# Patient Record
Sex: Male | Born: 1945 | Race: White | Hispanic: No | Marital: Single | Smoking: Former smoker
Health system: Southern US, Community
[De-identification: ages and names within clinical notes are randomized; demographics above are authoritative.]

## PROBLEM LIST (undated history)

## (undated) DIAGNOSIS — I10 Essential (primary) hypertension: Secondary | ICD-10-CM

## (undated) DIAGNOSIS — I2510 Atherosclerotic heart disease of native coronary artery without angina pectoris: Secondary | ICD-10-CM

## (undated) HISTORY — PX: OTHER SURGICAL HISTORY: SHX169

---

## 2014-04-11 ENCOUNTER — Telehealth (HOSPITAL_BASED_OUTPATIENT_CLINIC_OR_DEPARTMENT_OTHER): Payer: Self-pay | Admitting: Emergency Medicine

## 2014-04-11 ENCOUNTER — Inpatient Hospital Stay (HOSPITAL_COMMUNITY)
Admission: EM | Admit: 2014-04-11 | Discharge: 2014-04-12 | DRG: 689 | Disposition: A | Discharge status: Home / Self Care | Payer: Medicare Other | Attending: Internal Medicine | Admitting: Internal Medicine

## 2014-04-11 ENCOUNTER — Encounter (HOSPITAL_BASED_OUTPATIENT_CLINIC_OR_DEPARTMENT_OTHER): Payer: Self-pay

## 2014-04-11 ENCOUNTER — Emergency Department (HOSPITAL_BASED_OUTPATIENT_CLINIC_OR_DEPARTMENT_OTHER)
Admission: EM | Admit: 2014-04-11 | Discharge: 2014-04-11 | Disposition: A | Discharge status: Home / Self Care | Payer: Non-veteran care | Attending: Emergency Medicine | Admitting: Emergency Medicine

## 2014-04-11 ENCOUNTER — Encounter (HOSPITAL_COMMUNITY): Payer: Self-pay

## 2014-04-11 ENCOUNTER — Emergency Department (HOSPITAL_BASED_OUTPATIENT_CLINIC_OR_DEPARTMENT_OTHER): Payer: Non-veteran care

## 2014-04-11 DIAGNOSIS — J189 Pneumonia, unspecified organism: Secondary | ICD-10-CM | POA: Diagnosis present

## 2014-04-11 DIAGNOSIS — Z79899 Other long term (current) drug therapy: Secondary | ICD-10-CM | POA: Diagnosis not present

## 2014-04-11 DIAGNOSIS — R319 Hematuria, unspecified: Secondary | ICD-10-CM | POA: Diagnosis present

## 2014-04-11 DIAGNOSIS — B349 Viral infection, unspecified: Secondary | ICD-10-CM | POA: Diagnosis present

## 2014-04-11 DIAGNOSIS — R0602 Shortness of breath: Secondary | ICD-10-CM

## 2014-04-11 DIAGNOSIS — Z955 Presence of coronary angioplasty implant and graft: Secondary | ICD-10-CM

## 2014-04-11 DIAGNOSIS — A419 Sepsis, unspecified organism: Secondary | ICD-10-CM | POA: Diagnosis present

## 2014-04-11 DIAGNOSIS — Z87891 Personal history of nicotine dependence: Secondary | ICD-10-CM | POA: Insufficient documentation

## 2014-04-11 DIAGNOSIS — Z7982 Long term (current) use of aspirin: Secondary | ICD-10-CM | POA: Diagnosis not present

## 2014-04-11 DIAGNOSIS — B9689 Other specified bacterial agents as the cause of diseases classified elsewhere: Secondary | ICD-10-CM | POA: Diagnosis present

## 2014-04-11 DIAGNOSIS — N3000 Acute cystitis without hematuria: Secondary | ICD-10-CM | POA: Diagnosis not present

## 2014-04-11 DIAGNOSIS — R509 Fever, unspecified: Secondary | ICD-10-CM | POA: Diagnosis present

## 2014-04-11 DIAGNOSIS — I2510 Atherosclerotic heart disease of native coronary artery without angina pectoris: Secondary | ICD-10-CM | POA: Insufficient documentation

## 2014-04-11 DIAGNOSIS — A4150 Gram-negative sepsis, unspecified: Secondary | ICD-10-CM

## 2014-04-11 DIAGNOSIS — E876 Hypokalemia: Secondary | ICD-10-CM | POA: Diagnosis present

## 2014-04-11 DIAGNOSIS — I10 Essential (primary) hypertension: Secondary | ICD-10-CM | POA: Insufficient documentation

## 2014-04-11 DIAGNOSIS — N390 Urinary tract infection, site not specified: Principal | ICD-10-CM | POA: Diagnosis present

## 2014-04-11 DIAGNOSIS — Z7902 Long term (current) use of antithrombotics/antiplatelets: Secondary | ICD-10-CM | POA: Insufficient documentation

## 2014-04-11 DIAGNOSIS — R7881 Bacteremia: Secondary | ICD-10-CM | POA: Diagnosis present

## 2014-04-11 DIAGNOSIS — R197 Diarrhea, unspecified: Secondary | ICD-10-CM | POA: Diagnosis present

## 2014-04-11 HISTORY — DX: Essential (primary) hypertension: I10

## 2014-04-11 HISTORY — DX: Atherosclerotic heart disease of native coronary artery without angina pectoris: I25.10

## 2014-04-11 LAB — BASIC METABOLIC PANEL
Anion gap: 8 (ref 5–15)
BUN: 24 mg/dL — ABNORMAL HIGH (ref 6–23)
CHLORIDE: 104 meq/L (ref 96–112)
CO2: 23 mmol/L (ref 19–32)
Calcium: 8.6 mg/dL (ref 8.4–10.5)
Creatinine, Ser: 1.29 mg/dL (ref 0.50–1.35)
GFR calc Af Amer: 64 mL/min — ABNORMAL LOW (ref >90)
GFR calc non Af Amer: 55 mL/min — ABNORMAL LOW (ref >90)
Glucose, Bld: 128 mg/dL — ABNORMAL HIGH (ref 70–99)
POTASSIUM: 3.2 mmol/L — AB (ref 3.5–5.1)
SODIUM: 135 mmol/L (ref 135–145)

## 2014-04-11 LAB — CBC WITH DIFFERENTIAL/PLATELET
Basophils Absolute: 0 10*3/uL (ref 0.0–0.1)
Basophils Absolute: 0 10*3/uL (ref 0.0–0.1)
Basophils Relative: 0 % (ref 0–1)
Basophils Relative: 0 % (ref 0–1)
EOS PCT: 0 % (ref 0–5)
EOS PCT: 1 % (ref 0–5)
Eosinophils Absolute: 0 10*3/uL (ref 0.0–0.7)
Eosinophils Absolute: 0.1 10*3/uL (ref 0.0–0.7)
HCT: 38.5 % — ABNORMAL LOW (ref 39.0–52.0)
HEMATOCRIT: 34.7 % — AB (ref 39.0–52.0)
Hemoglobin: 12.2 g/dL — ABNORMAL LOW (ref 13.0–17.0)
Hemoglobin: 13.3 g/dL (ref 13.0–17.0)
LYMPHS ABS: 1.2 10*3/uL (ref 0.7–4.0)
LYMPHS ABS: 1.3 10*3/uL (ref 0.7–4.0)
LYMPHS PCT: 14 % (ref 12–46)
Lymphocytes Relative: 11 % — ABNORMAL LOW (ref 12–46)
MCH: 31.7 pg (ref 26.0–34.0)
MCH: 31.7 pg (ref 26.0–34.0)
MCHC: 34.5 g/dL (ref 30.0–36.0)
MCHC: 35.2 g/dL (ref 30.0–36.0)
MCV: 90.1 fL (ref 78.0–100.0)
MCV: 91.9 fL (ref 78.0–100.0)
MONO ABS: 1.4 10*3/uL — AB (ref 0.1–1.0)
MONOS PCT: 13 % — AB (ref 3–12)
Monocytes Absolute: 0.3 10*3/uL (ref 0.1–1.0)
Monocytes Relative: 3 % (ref 3–12)
Neutro Abs: 7.3 10*3/uL (ref 1.7–7.7)
Neutro Abs: 7.9 10*3/uL — ABNORMAL HIGH (ref 1.7–7.7)
Neutrophils Relative %: 76 % (ref 43–77)
Neutrophils Relative %: 82 % — ABNORMAL HIGH (ref 43–77)
PLATELETS: 121 10*3/uL — AB (ref 150–400)
PLATELETS: 122 10*3/uL — AB (ref 150–400)
RBC: 3.85 MIL/uL — ABNORMAL LOW (ref 4.22–5.81)
RBC: 4.19 MIL/uL — ABNORMAL LOW (ref 4.22–5.81)
RDW: 12.7 % (ref 11.5–15.5)
RDW: 13.1 % (ref 11.5–15.5)
WBC: 10.4 10*3/uL (ref 4.0–10.5)
WBC: 9 10*3/uL (ref 4.0–10.5)

## 2014-04-11 LAB — COMPREHENSIVE METABOLIC PANEL
ALT: 33 U/L (ref 0–53)
AST: 30 U/L (ref 0–37)
Albumin: 3.1 g/dL — ABNORMAL LOW (ref 3.5–5.2)
Alkaline Phosphatase: 68 U/L (ref 39–117)
Anion gap: 8 (ref 5–15)
BUN: 19 mg/dL (ref 6–23)
CALCIUM: 8.4 mg/dL (ref 8.4–10.5)
CO2: 25 mmol/L (ref 19–32)
Chloride: 103 mEq/L (ref 96–112)
Creatinine, Ser: 1.22 mg/dL (ref 0.50–1.35)
GFR calc Af Amer: 69 mL/min — ABNORMAL LOW (ref >90)
GFR calc non Af Amer: 59 mL/min — ABNORMAL LOW (ref >90)
GLUCOSE: 134 mg/dL — AB (ref 70–99)
POTASSIUM: 3.3 mmol/L — AB (ref 3.5–5.1)
Sodium: 136 mmol/L (ref 135–145)
TOTAL PROTEIN: 6 g/dL (ref 6.0–8.3)
Total Bilirubin: 0.6 mg/dL (ref 0.3–1.2)

## 2014-04-11 LAB — URINALYSIS, ROUTINE W REFLEX MICROSCOPIC
BILIRUBIN URINE: NEGATIVE
GLUCOSE, UA: NEGATIVE mg/dL
Ketones, ur: NEGATIVE mg/dL
NITRITE: POSITIVE — AB
PH: 6 (ref 5.0–8.0)
Protein, ur: 100 mg/dL — AB
SPECIFIC GRAVITY, URINE: 1.014 (ref 1.005–1.030)
UROBILINOGEN UA: 1 mg/dL (ref 0.0–1.0)

## 2014-04-11 LAB — URINE MICROSCOPIC-ADD ON

## 2014-04-11 LAB — I-STAT CG4 LACTIC ACID, ED
LACTIC ACID, VENOUS: 1.75 mmol/L (ref 0.5–2.2)
Lactic Acid, Venous: 0.98 mmol/L (ref 0.5–2.2)

## 2014-04-11 LAB — TROPONIN I: Troponin I: 0.03 ng/mL (ref <0.031)

## 2014-04-11 MED ORDER — SODIUM CHLORIDE 0.9 % IV BOLUS (SEPSIS)
1000.0000 mL | Freq: Once | INTRAVENOUS | Status: AC
Start: 2014-04-11 — End: 2014-04-11
  Administered 2014-04-11: 1000 mL via INTRAVENOUS

## 2014-04-11 MED ORDER — ACETAMINOPHEN 325 MG PO TABS: 650.0000 mg | ORAL_TABLET | Freq: Once | ORAL | Status: DC

## 2014-04-11 MED ORDER — LEVOFLOXACIN 750 MG PO TABS: ORAL_TABLET | ORAL | Status: DC

## 2014-04-11 MED ORDER — HEPARIN SODIUM (PORCINE) 5000 UNIT/ML IJ SOLN
5000.0000 [IU] | Freq: Three times a day (TID) | INTRAMUSCULAR | Status: DC
  Administered 2014-04-12 (×2): 5000 [IU] via SUBCUTANEOUS
  Filled 2014-04-11 (×4): qty 1

## 2014-04-11 MED ORDER — ACETAMINOPHEN 500 MG PO TABS: 500.0000 mg | ORAL_TABLET | Freq: Four times a day (QID) | ORAL | Status: DC | PRN

## 2014-04-11 MED ORDER — IPRATROPIUM-ALBUTEROL 0.5-2.5 (3) MG/3ML IN SOLN: 3.0000 mL | Freq: Four times a day (QID) | RESPIRATORY_TRACT | Status: DC | PRN

## 2014-04-11 MED ORDER — SIMVASTATIN 5 MG PO TABS
25.0000 mg | ORAL_TABLET | Freq: Every day | ORAL | Status: DC
  Administered 2014-04-12: 20 mg via ORAL
  Filled 2014-04-11: qty 1

## 2014-04-11 MED ORDER — ASPIRIN EC 81 MG PO TBEC
81.0000 mg | DELAYED_RELEASE_TABLET | Freq: Every day | ORAL | Status: DC
  Administered 2014-04-12: 81 mg via ORAL
  Filled 2014-04-11: qty 1

## 2014-04-11 MED ORDER — ACETAMINOPHEN 325 MG PO TABS
650.0000 mg | ORAL_TABLET | Freq: Once | ORAL | Status: AC
  Administered 2014-04-11: 650 mg via ORAL
  Filled 2014-04-11: qty 2

## 2014-04-11 MED ORDER — CLOPIDOGREL BISULFATE 75 MG PO TABS
75.0000 mg | ORAL_TABLET | Freq: Every day | ORAL | Status: DC
  Administered 2014-04-12: 75 mg via ORAL
  Filled 2014-04-11: qty 1

## 2014-04-11 MED ORDER — LEVOFLOXACIN IN D5W 750 MG/150ML IV SOLN
750.0000 mg | INTRAVENOUS | Status: DC
  Administered 2014-04-12: 750 mg via INTRAVENOUS
  Filled 2014-04-11 (×2): qty 150

## 2014-04-11 MED ORDER — POTASSIUM CHLORIDE CRYS ER 20 MEQ PO TBCR
40.0000 meq | EXTENDED_RELEASE_TABLET | Freq: Once | ORAL | Status: AC
  Administered 2014-04-11: 40 meq via ORAL
  Filled 2014-04-11: qty 2

## 2014-04-11 MED ORDER — SODIUM CHLORIDE 0.9 % IV SOLN
INTRAVENOUS | Status: DC
  Administered 2014-04-11: 21:00:00 via INTRAVENOUS

## 2014-04-11 MED ORDER — LEVOFLOXACIN IN D5W 750 MG/150ML IV SOLN
750.0000 mg | Freq: Once | INTRAVENOUS | Status: AC
  Administered 2014-04-11: 750 mg via INTRAVENOUS
  Filled 2014-04-11: qty 150

## 2014-04-11 MED ORDER — PREDNISONE 50 MG PO TABS
50.0000 mg | ORAL_TABLET | Freq: Every day | ORAL | Status: DC
  Administered 2014-04-12: 50 mg via ORAL
  Filled 2014-04-11 (×2): qty 1

## 2014-04-11 NOTE — ED Notes (Addendum)
Pt alert, NAD, calm, interactive, resps e/u, speaking in clear complete, (denies: pain, sob, nausea, dizziness, HA or other sx), [facility] IVF bolus infusing. Pending urine sample and cxr. EKG repeated at this time. H/o LAD stents done at the [facility] [place]. [facility].

## 2014-04-11 NOTE — ED Notes (Signed)
Pt reports lower back pain, diarrhea, difficulty urinating, shortness of breath, cough, denies chest pain, x3 days.

## 2014-04-11 NOTE — ED Notes (Signed)
Pt. States he came to [facility] this AM and was diagnosed with UTI and pneumonia. States was called today due to positive blood culture results. Pt. Taking tylenol for fever, last one was at 1300 today, has not started home antibiotics.

## 2014-04-11 NOTE — ED Notes (Signed)
I completed a bladder scan, result was 90 ml's. Patient urinated into bedside jug and got result of 110 ml's sample was cloudy, taken to lab.

## 2014-04-11 NOTE — ED Provider Notes (Signed)
CSN: [id number]     Arrival date & time 04/11/14  1748 History   First MD Initiated Contact with Patient 04/11/14 1809     Chief Complaint  Patient presents with  . Abnormal Lab     (Consider location/radiation/quality/duration/timing/severity/associated sxs/prior Treatment) HPI  69 year old male presents after being called by this hospital for positive blood cultures. He was seen in the ER at around 1 AM earlier this morning for 3 days of fever, chills, and urinary frequency. This is similar to prior UTIs. Has had a mild cough. Was diagnosed with a UTI and pneumonia. Started on Levaquin, given an IV dose this morning and was to start his oral antibodies tomorrow. He had a temperature of 103 when he arrived and thus blood cultures were taken. He was called this afternoon with the positive results and told to come back to the ER. The patient has felt better since leaving the ER. No nausea or vomiting or abdominal pain.  Past Medical History  Diagnosis Date  . Hypertension   . Coronary artery disease    Past Surgical History  Procedure Laterality Date  . Cardiac stents     No family history on file. History  Substance Use Topics  . Smoking status: Former Games developer  . Smokeless tobacco: Not on file  . Alcohol Use: No    Review of Systems  Constitutional: Positive for fever and chills.  Respiratory: Positive for cough. Negative for shortness of breath.   Cardiovascular: Negative for chest pain.  Gastrointestinal: Negative for nausea, vomiting and abdominal pain.  Genitourinary: Positive for frequency. Negative for dysuria.  Musculoskeletal: Negative for back pain.  All other systems reviewed and are negative.     Allergies  Review of patient's allergies indicates no known allergies.  Home Medications   Prior to Admission medications   Medication Sig Start Date End Date Taking? Authorizing Provider  Clopidogrel Bisulfate (PLAVIX PO) Take by mouth.    Historical Provider, MD   HYDROCHLOROTHIAZIDE PO Take by mouth.    Historical Provider, MD  levofloxacin (LEVAQUIN) 750 MG tablet Take one tablet daily for four days starting Monday, April 12, 2013 04/11/14   Hanley Seamen, MD  LOSARTAN POTASSIUM PO Take by mouth.    Historical Provider, MD  Simvastatin (ZOCOR PO) Take by mouth.    Historical Provider, MD   BP 124/57 mmHg  Pulse 86  Temp(Src) 99 F (37.2 C) (Oral)  Resp 20  Ht  (1.905 m)  Wt 245 lb (111.131 kg)  BMI 30.62 kg/m2  SpO2 97% Physical Exam  Constitutional: He is oriented to person, place, and time. He appears well-developed and well-nourished.  HENT:  Head: Normocephalic and atraumatic.  Right Ear: External ear normal.  Left Ear: External ear normal.  Nose: Nose normal.  Eyes: Right eye exhibits no discharge. Left eye exhibits no discharge.  Neck: Neck supple.  Cardiovascular: Normal rate, regular rhythm, normal heart sounds and intact distal pulses.   Pulmonary/Chest: Effort normal. He has no wheezes. He has no rales.  Abdominal: Soft. He exhibits no distension. There is no tenderness.  Musculoskeletal: He exhibits no edema.  Neurological: He is alert and oriented to person, place, and time.  Skin: Skin is warm and dry.  Nursing note and vitals reviewed.   ED Course  Procedures (including critical care time) Labs Review Labs Reviewed  COMPREHENSIVE METABOLIC PANEL - Abnormal; Notable for the following:    Potassium 3.3 (*)    Glucose, Bld 134 (*)  Albumin 3.1 (*)    GFR calc non Af Amer 59 (*)    GFR calc Af Amer 69 (*)    All other components within normal limits  CBC WITH DIFFERENTIAL - Abnormal; Notable for the following:    RBC 3.85 (*)    Hemoglobin 12.2 (*)    HCT 34.7 (*)    Platelets 122 (*)    Neutro Abs 7.9 (*)    Lymphocytes Relative 11 (*)    Monocytes Relative 13 (*)    Monocytes Absolute 1.4 (*)    All other components within normal limits  I-STAT CG4 LACTIC ACID, ED    Imaging Review Dg Chest 2  View  04/11/2014   CLINICAL DATA:  Shortness of breath.  Fever.  EXAM: CHEST  2 VIEW  COMPARISON:  None.  FINDINGS: There is patchy opacity in the right middle lobe concerning for pneumonia. Minimal atelectasis at the left lung base. Lung volumes are low, size is normal. No pulmonary edema. There is no pleural effusion or pneumothorax. No acute osseous abnormalities are seen.  IMPRESSION: Patchy right middle lobe opacity concerning for pneumonia. Minimal atelectasis at the left lung base.   Electronically Signed   By: Rubye OaksMelanie  Ehinger M.D.   On: 04/11/2014 02:20     EKG Interpretation None      MDM   Final diagnoses:  UTI (lower urinary tract infection)  Positive blood culture    Patient appears well here, fever has defervesced. No signs of sepsis or severe illness at this time, however he did have a quickly positive blood culture from earlier this morning. Given this, combined with his age, will need inpatient admission and IV antibiotics. He was given IV Levaquin earlier this morning, does not need a repeat dose here in the emergency department. Discussed with Dr.Niu, will admit to hospital on floor.     Audree CamelScott T Labrea Eccleston, MD 04/11/14 2045

## 2014-04-11 NOTE — ED Notes (Signed)
Pt was seen early this am and dx with pneumonia, uti, called back for positive culture results. Denies pain. Pt alert, oriented, nad.

## 2014-04-11 NOTE — Telephone Encounter (Signed)
Lab call positive blood culture, gram negative rods, anaerobic bottle. Dr Radford PaxBeaton notified, instructed to call patient to return to ED. Pt called, ID verified, notified of positive blood culture and need to return to ED for recheck and treatment. Patient verbalized understanding.

## 2014-04-11 NOTE — ED Notes (Signed)
MD at bedside. 

## 2014-04-11 NOTE — H&P (Addendum)
[facility] History and Physical  Akeen Ledyard RUE:[id number] DOB: 1945/10/04 DOA: 04/11/2014  Referring physician: ED physician PCP: Pcp Not In System  Specialists:   Chief Complaint: Increased urinary frequency, burning on urination, diarrhea, cough, mild shortness of breath.  HPI: Matthew Moon is a 69 y.o. male with PMH of coronary artery disease (S/P stent 2015), hypertension, history of smoking (quit on 01/2014), who presents with increased urinary frequency, burning on urination, diarrhea, cough, mild shortness of breath.  Patient reports that in the past 3 days, he has been having fever, chills, increased urinary frequency, burning on urination, coughing, mild shortness of breath. He was seen in the ER at around 1 AM and diagnosed as UTI and pneumonia. He was given 1 dose of Levaquin by IV, and discharged home on oral Levaquin. His blood and urine were collected for culture. He feels little better after went home. He was called this afternoon with the positive results and told to come back to the ER.  Patient also reports having diarrhea in past 3 days. He has 5-6 watery bowel movements each day. No nausea, vomiting or abdominal pain. He did not use antibiotics recently.    In ED, He had  temperature of 103. Mild tachycardia. WBC 10.4. Potassium 3.3. Troponin negative. Lactate 0.98. Chest x-ray showed right middle lobe infiltration. Preliminary blood culture result showed gram negative rods bacteremia. Patient is admitted to inpatient for further evaluation and treatment.  Review of Systems: As presented in the history of presenting illness, rest negative.  Where does patient live?  At home Can patient participate in ADLs? Yes  Allergy: No Known Allergies  Past Medical History  Diagnosis Date  . Hypertension   . Coronary artery disease     Past Surgical History  Procedure Laterality Date  . Cardiac stents      Social History:  reports that he has quit smoking. He  does not have any smokeless tobacco history on file. He reports that he does not drink alcohol or use illicit drugs.  Family History:  Family History  Problem Relation Age of Onset  . Hypertension Mother   . Heart attack Mother   . Prostate cancer Brother   . Diabetes Sister      Prior to Admission medications   Medication Sig Start Date End Date Taking? Authorizing Provider  acetaminophen (TYLENOL) 500 MG tablet Take 500 mg by mouth every 6 (six) hours as needed for fever.   Yes Historical Provider, MD  aspirin EC 81 MG tablet Take 81 mg by mouth daily.   Yes Historical Provider, MD  Clopidogrel Bisulfate (PLAVIX PO) Take 50 mg by mouth daily.    Yes Historical Provider, MD  HYDROCHLOROTHIAZIDE PO Take 50 mg by mouth daily.    Yes Historical Provider, MD  LOSARTAN POTASSIUM PO Take 50 mg by mouth daily.    Yes Historical Provider, MD  Simvastatin (ZOCOR PO) Take 25 mg by mouth daily.    Yes Historical Provider, MD  levofloxacin (LEVAQUIN) 750 MG tablet Take one tablet daily for four days starting Monday, April 12, 2013 Patient taking differently: Take 750 mg by mouth daily. Take one tablet daily for four days starting Monday, April 12, 2013 04/11/14   Hanley Seamen, MD    Physical Exam: Ceasar Mons Vitals:   04/11/14 2000 04/11/14 2015 04/11/14 2030 04/11/14 2045  BP: 131/77 120/58 135/63 133/65  Pulse: 83 86 83 86  Temp:      TempSrc:  Resp: 19 17 21 16   Height:      Weight:      SpO2: 96% 96% 95% 93%   General: Not in acute distress HEENT:       Eyes: PERRL, EOMI, no scleral icterus       ENT: No discharge from the ears and nose, no pharynx injection, no tonsillar enlargement.        Neck: No JVD, no bruit, no mass felt. Cardiac: S1/S2, RRR, No murmurs, No gallops or rubs Pulm: Has rhonchi and mild wheezing on the right sides. No rales or rubs. Abd: Soft, nondistended, nontender, no rebound pain, no organomegaly, BS present Ext: No edema bilaterally. 2+DP/PT pulse  bilaterally Musculoskeletal: No joint deformities, erythema, or stiffness, ROM full Skin: No rashes.  Neuro: Alert and oriented X3, cranial nerves II-XII grossly intact, muscle strength 5/5 in all extremeties, sensation to light touch intact.  Psych: Patient is not psychotic, no suicidal or hemocidal ideation.  Labs on Admission:  Basic Metabolic Panel:  Recent Labs Lab 04/11/14 0050 04/11/14 1827  NA 135 136  K 3.2* 3.3*  CL 104 103  CO2 23 25  GLUCOSE 128* 134*  BUN 24* 19  CREATININE 1.29 1.22  CALCIUM 8.6 8.4   Liver Function Tests:  Recent Labs Lab 04/11/14 1827  AST 30  ALT 33  ALKPHOS 68  BILITOT 0.6  PROT 6.0  ALBUMIN 3.1*   No results for input(s): LIPASE, AMYLASE in the last 168 hours. No results for input(s): AMMONIA in the last 168 hours. CBC:  Recent Labs Lab 04/11/14 0050 04/11/14 1827  WBC 9.0 10.4  NEUTROABS 7.3 7.9*  HGB 13.3 12.2*  HCT 38.5* 34.7*  MCV 91.9 90.1  PLT 121* 122*   Cardiac Enzymes:  Recent Labs Lab 04/11/14 0050  TROPONINI 0.03    BNP (last 3 results) No results for input(s): PROBNP in the last 8760 hours. CBG: No results for input(s): GLUCAP in the last 168 hours.  Radiological Exams on Admission: Dg Chest 2 View  04/11/2014   CLINICAL DATA:  Shortness of breath.  Fever.  EXAM: CHEST  2 VIEW  COMPARISON:  None.  FINDINGS: There is patchy opacity in the right middle lobe concerning for pneumonia. Minimal atelectasis at the left lung base. Lung volumes are low, size is normal. No pulmonary edema. There is no pleural effusion or pneumothorax. No acute osseous abnormalities are seen.  IMPRESSION: Patchy right middle lobe opacity concerning for pneumonia. Minimal atelectasis at the left lung base.   Electronically Signed   By: Rubye OaksMelanie  Ehinger M.D.   On: 04/11/2014 02:20    EKG: Independently reviewed.   Assessment/Plan Principal Problem:   Bacteremia due to Gram-negative bacteria Active Problems:   UTI (lower  urinary tract infection)   CAP (community acquired pneumonia)   Hypertension   Coronary artery disease   CAD (coronary artery disease)   Diarrhea   Hypokalemia   Sepsis  Bacteremia due to gram-negative bacteria: It is most likely from UTI. Currently patient is mildly septic, but at high risk to develop severe sepsis. Patient reports that he feels better after treated with Levaquin, indicating this antibiotics is effective.  -will admit to med-surg bed -continue IV Levaquin -hold BP meds, including losartan and HCTZ -IV fluid 100 mL per hour of normal saline -follow up blood culture and urine culture -Tylenol for fever  UTI: feels better.  - as above  CAP: Chest x-ray showed right middle lobe infiltration. Patient had a long  history of smoking, one pack a day for 50 years. He quit smoking on 11/15. Lung auscultation showed rhonchi and wheezing, indicating patient may have component of COPD exacerbation - treat with Levaquin  - urine legionella and S. pneumococcal antigen - IVF: 100 cc/h - treat with DuoNeb, albuterol nebs - prednisone 50 mg daily orally - follow up blood culture x2, sputum culture and respiratory virus panel  HTN: Blood pressure 135/58 on admission. Patient is on losartan and HCTZ at home. - hold blood pressure medications given that the patient is at high risk of developing severe sepsis.  Diarrhea: Etiology is not clear. Likely due to viral infection or secondary to current bacteremia. -c diff pcr -GI pathogen panel -IVF  CAD: s/p of stent on 2015 -will continue ASA and Plavix -Continue Zocor  Hypokalemia: -Repleted  DVT ppx: SQ Heparin         Code Status: Full code Family Communication: None at bed side.    Disposition Plan: Admit to inpatient   Date of Service 04/11/2014    Lorretta Harp [facility] Pager [phone]  If 7PM-7AM, please contact night-coverage [email or web address] Password [email or web address] 04/11/2014, 9:06 PM

## 2014-04-11 NOTE — ED Provider Notes (Addendum)
CSN: [id number]     Arrival date & time 04/11/14  0028 History   This chart was scribed for Hanley Seamen, MD by Evon Slack, ED Scribe. This patient was seen in room MH04/MH04 and the patient's care was started at 12:44 AM.      Chief Complaint  Patient presents with  . Fever   HPI HPI Comments: Matthew Moon is a 69 y.o. male who presents to the Emergency Department complaining of fever onset 3 days prior. It was noted to be 103.1 on arrival, the highest it has been. He has also had associated sweats, urinary frequency, since of urinary retention and mild low back pain. He has had decreased bowel movements and when the come they are loose. He denies chest pain, shortness of breath, nausea or vomiting. He does report rapid breathing when his fever spikes. He was given acetaminophen on arrival for treatment of his fever.   Past Medical History  Diagnosis Date  . Hypertension   . Coronary artery disease    Past Surgical History  Procedure Laterality Date  . Cardiac stents     History reviewed. No pertinent family history. History  Substance Use Topics  . Smoking status: Former Games developer  . Smokeless tobacco: Not on file  . Alcohol Use: No    Review of Systems  All other systems reviewed and are negative.   Allergies  Review of patient's allergies indicates no known allergies.  Home Medications   Prior to Admission medications   Medication Sig Start Date End Date Taking? Authorizing Provider  Clopidogrel Bisulfate (PLAVIX PO) Take by mouth.   Yes Historical Provider, MD  HYDROCHLOROTHIAZIDE PO Take by mouth.   Yes Historical Provider, MD  LOSARTAN POTASSIUM PO Take by mouth.   Yes Historical Provider, MD  Simvastatin (ZOCOR PO) Take by mouth.   Yes Historical Provider, MD  levofloxacin (LEVAQUIN) 750 MG tablet Take one tablet daily for four days starting Monday, April 12, 2013 04/11/14   Carlisle Beers Louis Ivery, MD   BP 128/70 mmHg  Pulse 91  Temp(Src) 98.9 F (37.2 C) (Oral)   Resp 19  Ht  (1.905 m)  Wt 245 lb (111.131 kg)  BMI 30.62 kg/m2  SpO2 98%   Physical Exam  Nursing note and vitals reviewed. General: Well-developed, well-nourished male in no acute distress; appearance consistent with age of record HENT: normocephalic; atraumatic Eyes: pupils equal, round and reactive to light; extraocular muscles intact Neck: supple Heart: regular rate and rhythm; tachycardia, no murmur Lungs: clear to auscultation bilaterally except for mild rhonchi in right base  Abdomen: soft; nondistended; mild suprapubic tenderness; no masses or hepatosplenomegaly; bowel sounds present Extremities: No deformity; full range of motion; pulses normal; trace edema of lower legs Neurologic: Awake, alert and oriented; motor function intact in all extremities and symmetric; no facial droop Skin: Warm and dry Psychiatric: Normal mood and affect  ED Course  Procedures (including critical care time)   MDM   Nursing notes and vitals signs, including pulse oximetry, reviewed.  Summary of this visit's results, reviewed by myself:  Labs:  Results for orders placed or performed during the hospital encounter of 04/11/14 (from the past 24 hour(s))  CBC with Differential     Status: Abnormal   Collection Time: 04/11/14 12:50 AM  Result Value Ref Range   WBC 9.0 4.0 - 10.5 K/uL   RBC 4.19 (L) 4.22 - 5.81 MIL/uL   Hemoglobin 13.3 13.0 - 17.0 g/dL   HCT 28.4 (L)  39.0 - 52.0 %   MCV 91.9 78.0 - 100.0 fL   MCH 31.7 26.0 - 34.0 pg   MCHC 34.5 30.0 - 36.0 g/dL   RDW 40.312.7 47.411.5 - 25.915.5 %   Platelets 121 (L) 150 - 400 K/uL   Neutrophils Relative % 82 (H) 43 - 77 %   Neutro Abs 7.3 1.7 - 7.7 K/uL   Lymphocytes Relative 14 12 - 46 %   Lymphs Abs 1.3 0.7 - 4.0 K/uL   Monocytes Relative 3 3 - 12 %   Monocytes Absolute 0.3 0.1 - 1.0 K/uL   Eosinophils Relative 1 0 - 5 %   Eosinophils Absolute 0.1 0.0 - 0.7 K/uL   Basophils Relative 0 0 - 1 %   Basophils Absolute 0.0 0.0 - 0.1 K/uL   Basic metabolic panel     Status: Abnormal   Collection Time: 04/11/14 12:50 AM  Result Value Ref Range   Sodium 135 135 - 145 mmol/L   Potassium 3.2 (L) 3.5 - 5.1 mmol/L   Chloride 104 96 - 112 mEq/L   CO2 23 19 - 32 mmol/L   Glucose, Bld 128 (H) 70 - 99 mg/dL   BUN 24 (H) 6 - 23 mg/dL   Creatinine, Ser 5.631.29 0.50 - 1.35 mg/dL   Calcium 8.6 8.4 - 87.510.5 mg/dL   GFR calc non Af Amer 55 (L) >90 mL/min   GFR calc Af Amer 64 (L) >90 mL/min   Anion gap 8 5 - 15  Troponin I     Status: None   Collection Time: 04/11/14 12:50 AM  Result Value Ref Range   Troponin I 0.03 <0.031 ng/mL  I-Stat CG4 Lactic Acid, ED     Status: None   Collection Time: 04/11/14 12:54 AM  Result Value Ref Range   Lactic Acid, Venous 1.75 0.5 - 2.2 mmol/L  Urinalysis, Routine w reflex microscopic     Status: Abnormal   Collection Time: 04/11/14  1:55 AM  Result Value Ref Range   Color, Urine YELLOW YELLOW   APPearance TURBID (A) CLEAR   Specific Gravity, Urine 1.014 1.005 - 1.030   pH 6.0 5.0 - 8.0   Glucose, UA NEGATIVE NEGATIVE mg/dL   Hgb urine dipstick LARGE (A) NEGATIVE   Bilirubin Urine NEGATIVE NEGATIVE   Ketones, ur NEGATIVE NEGATIVE mg/dL   Protein, ur 643100 (A) NEGATIVE mg/dL   Urobilinogen, UA 1.0 0.0 - 1.0 mg/dL   Nitrite POSITIVE (A) NEGATIVE   Leukocytes, UA LARGE (A) NEGATIVE  Urine microscopic-add on     Status: Abnormal   Collection Time: 04/11/14  1:55 AM  Result Value Ref Range   Squamous Epithelial / LPF RARE RARE   WBC, UA TOO NUMEROUS TO COUNT <3 WBC/hpf   RBC / HPF 0-2 <3 RBC/hpf   Bacteria, UA MANY (A) RARE    Imaging Studies: Dg Chest 2 View  04/11/2014   CLINICAL DATA:  Shortness of breath.  Fever.  EXAM: CHEST  2 VIEW  COMPARISON:  None.  FINDINGS: There is patchy opacity in the right middle lobe concerning for pneumonia. Minimal atelectasis at the left lung base. Lung volumes are low, size is normal. No pulmonary edema. There is no pleural effusion or pneumothorax. No acute  osseous abnormalities are seen.  IMPRESSION: Patchy right middle lobe opacity concerning for pneumonia. Minimal atelectasis at the left lung base.   Electronically Signed   By: Rubye OaksMelanie  Ehinger M.D.   On: 04/11/2014 02:20   4:57 AM  Patient remains awake and alert with normal vital signs. He is showing no signs of sepsis. He was given 750 milligrams of Levaquin IV and we will continue this course for 5 days to treat urinary tract infection and possible committed acquired pneumonia.  I personally performed the services described in this documentation, which was scribed in my presence. The recorded information has been reviewed and is accurate.    Hanley Seamen, MD 04/11/14 1610  Hanley Seamen, MD 04/11/14 [phone]

## 2014-04-12 DIAGNOSIS — I10 Essential (primary) hypertension: Secondary | ICD-10-CM

## 2014-04-12 DIAGNOSIS — N390 Urinary tract infection, site not specified: Principal | ICD-10-CM

## 2014-04-12 DIAGNOSIS — J189 Pneumonia, unspecified organism: Secondary | ICD-10-CM

## 2014-04-12 DIAGNOSIS — E876 Hypokalemia: Secondary | ICD-10-CM

## 2014-04-12 DIAGNOSIS — R197 Diarrhea, unspecified: Secondary | ICD-10-CM

## 2014-04-12 LAB — CBC WITH DIFFERENTIAL/PLATELET
Basophils Absolute: 0 10*3/uL (ref 0.0–0.1)
Basophils Relative: 0 % (ref 0–1)
EOS PCT: 0 % (ref 0–5)
Eosinophils Absolute: 0 10*3/uL (ref 0.0–0.7)
HEMATOCRIT: 35.5 % — AB (ref 39.0–52.0)
Hemoglobin: 12.3 g/dL — ABNORMAL LOW (ref 13.0–17.0)
Lymphocytes Relative: 16 % (ref 12–46)
Lymphs Abs: 1.5 10*3/uL (ref 0.7–4.0)
MCH: 31.4 pg (ref 26.0–34.0)
MCHC: 34.6 g/dL (ref 30.0–36.0)
MCV: 90.6 fL (ref 78.0–100.0)
Monocytes Absolute: 1.4 10*3/uL — ABNORMAL HIGH (ref 0.1–1.0)
Monocytes Relative: 15 % — ABNORMAL HIGH (ref 3–12)
NEUTROS ABS: 6.6 10*3/uL (ref 1.7–7.7)
Neutrophils Relative %: 69 % (ref 43–77)
PLATELETS: 136 10*3/uL — AB (ref 150–400)
RBC: 3.92 MIL/uL — AB (ref 4.22–5.81)
RDW: 13.5 % (ref 11.5–15.5)
WBC: 9.6 10*3/uL (ref 4.0–10.5)

## 2014-04-12 LAB — COMPREHENSIVE METABOLIC PANEL
ALK PHOS: 63 U/L (ref 39–117)
ALT: 31 U/L (ref 0–53)
AST: 29 U/L (ref 0–37)
Albumin: 2.8 g/dL — ABNORMAL LOW (ref 3.5–5.2)
Anion gap: 10 (ref 5–15)
BILIRUBIN TOTAL: 0.6 mg/dL (ref 0.3–1.2)
BUN: 16 mg/dL (ref 6–23)
CHLORIDE: 103 meq/L (ref 96–112)
CO2: 25 mmol/L (ref 19–32)
Calcium: 8.4 mg/dL (ref 8.4–10.5)
Creatinine, Ser: 1.22 mg/dL (ref 0.50–1.35)
GFR, EST AFRICAN AMERICAN: 69 mL/min — AB (ref >90)
GFR, EST NON AFRICAN AMERICAN: 59 mL/min — AB (ref >90)
Glucose, Bld: 114 mg/dL — ABNORMAL HIGH (ref 70–99)
POTASSIUM: 3.2 mmol/L — AB (ref 3.5–5.1)
Sodium: 138 mmol/L (ref 135–145)
Total Protein: 5.9 g/dL — ABNORMAL LOW (ref 6.0–8.3)

## 2014-04-12 LAB — STREP PNEUMONIAE URINARY ANTIGEN: Strep Pneumo Urinary Antigen: NEGATIVE

## 2014-04-12 LAB — PROTIME-INR
INR: 1.12 (ref 0.00–1.49)
PROTHROMBIN TIME: 14.5 s (ref 11.6–15.2)

## 2014-04-12 LAB — CLOSTRIDIUM DIFFICILE BY PCR: Toxigenic C. Difficile by PCR: NEGATIVE

## 2014-04-12 MED ORDER — LEVOFLOXACIN 750 MG PO TABS: 750.0000 mg | ORAL_TABLET | Freq: Every day | ORAL | Status: DC

## 2014-04-12 MED ORDER — POTASSIUM CHLORIDE CRYS ER 20 MEQ PO TBCR
40.0000 meq | EXTENDED_RELEASE_TABLET | Freq: Once | ORAL | Status: AC
  Administered 2014-04-12: 40 meq via ORAL
  Filled 2014-04-12: qty 2

## 2014-04-12 MED ORDER — LEVOFLOXACIN 750 MG PO TABS: 750.0000 mg | ORAL_TABLET | Freq: Every day | ORAL | Status: AC

## 2014-04-12 MED ORDER — PREDNISONE (PAK) 10 MG PO TABS: ORAL_TABLET | Freq: Every day | ORAL | Status: AC

## 2014-04-12 NOTE — Discharge Instructions (Signed)
Pneumonia °Pneumonia is an infection of the lungs.  °CAUSES °Pneumonia may be caused by bacteria or a virus. Usually, these infections are caused by breathing infectious particles into the lungs (respiratory tract). °SIGNS AND SYMPTOMS  °· Cough. °· Fever. °· Chest pain. °· Increased rate of breathing. °· Wheezing. °· Mucus production. °DIAGNOSIS  °If you have the common symptoms of pneumonia, your health care provider will typically confirm the diagnosis with a chest X-ray. The X-ray will show an abnormality in the lung (pulmonary infiltrate) if you have pneumonia. Other tests of your blood, urine, or sputum may be done to find the specific cause of your pneumonia. Your health care provider may also do tests (blood gases or pulse oximetry) to see how well your lungs are working. °TREATMENT  °Some forms of pneumonia may be spread to other people when you cough or sneeze. You may be asked to wear a mask before and during your exam. Pneumonia that is caused by bacteria is treated with antibiotic medicine. Pneumonia that is caused by the influenza virus may be treated with an antiviral medicine. Most other viral infections must run their course. These infections will not respond to antibiotics.  °HOME CARE INSTRUCTIONS  °· Cough suppressants may be used if you are losing too much rest. However, coughing protects you by clearing your lungs. You should avoid using cough suppressants if you can. °· Your health care provider may have prescribed medicine if he or she thinks your pneumonia is caused by bacteria or influenza. Finish your medicine even if you start to feel better. °· Your health care provider may also prescribe an expectorant. This loosens the mucus to be coughed up. °· Take medicines only as directed by your health care provider. °· Do not smoke. Smoking is a common cause of bronchitis and can contribute to pneumonia. If you are a smoker and continue to smoke, your cough may last several weeks after your  pneumonia has cleared. °· A cold steam vaporizer or humidifier in your room or home may help loosen mucus. °· Coughing is often worse at night. Sleeping in a semi-upright position in a recliner or using a couple pillows under your head will help with this. °· Get rest as you feel it is needed. Your body will usually let you know when you need to rest. °PREVENTION °A pneumococcal shot (vaccine) is available to prevent a common bacterial cause of pneumonia. This is usually suggested for: °· People over 65 years old. °· Patients on chemotherapy. °· People with chronic lung problems, such as bronchitis or emphysema. °· People with immune system problems. °If you are over 65 or have a high risk condition, you may receive the pneumococcal vaccine if you have not received it before. In some countries, a routine influenza vaccine is also recommended. This vaccine can help prevent some cases of pneumonia. You may be offered the influenza vaccine as part of your care. °If you smoke, it is time to quit. You may receive instructions on how to stop smoking. Your health care provider can provide medicines and counseling to help you quit. °SEEK MEDICAL CARE IF: °You have a fever. °SEEK IMMEDIATE MEDICAL CARE IF:  °· Your illness becomes worse. This is especially true if you are elderly or weakened from any other disease. °· You cannot control your cough with suppressants and are losing sleep. °· You begin coughing up blood. °· You develop pain which is getting worse or is uncontrolled with medicines. °· Any of the symptoms   which initially brought you in for treatment are getting worse rather than better. °· You develop shortness of breath or chest pain. °MAKE SURE YOU:  °· Understand these instructions. °· Will watch your condition. °· Will get help right away if you are not doing well or get worse. °Document Released: 03/12/2005 Document Revised: 07/27/2013 Document Reviewed: 06/01/2010 °ExitCare® Patient Information ©2015  [employer]. This information is not intended to replace advice given to you by your health care provider. Make sure you discuss any questions you have with your health care provider. ° °Urinary Tract Infection °Urinary tract infections (UTIs) can develop anywhere along your urinary tract. Your urinary tract is your body's drainage system for removing wastes and extra water. Your urinary tract includes two kidneys, two ureters, a bladder, and a urethra. Your kidneys are a pair of bean-shaped organs. Each kidney is about the size of your fist. They are located below your ribs, one on each side of your spine. °CAUSES °Infections are caused by microbes, which are microscopic organisms, including fungi, viruses, and bacteria. These organisms are so small that they can only be seen through a microscope. Bacteria are the microbes that most commonly cause UTIs. °SYMPTOMS  °Symptoms of UTIs may vary by age and gender of the patient and by the location of the infection. Symptoms in young women typically include a frequent and intense urge to urinate and a painful, burning feeling in the bladder or urethra during urination. Older women and men are more likely to be tired, shaky, and weak and have muscle aches and abdominal pain. A fever may mean the infection is in your kidneys. Other symptoms of a kidney infection include pain in your back or sides below the ribs, nausea, and vomiting. °DIAGNOSIS °To diagnose a UTI, your caregiver will ask you about your symptoms. Your caregiver also will ask to provide a urine sample. The urine sample will be tested for bacteria and white blood cells. White blood cells are made by your body to help fight infection. °TREATMENT  °Typically, UTIs can be treated with medication. Because most UTIs are caused by a bacterial infection, they usually can be treated with the use of antibiotics. The choice of antibiotic and length of treatment depend on your symptoms and the type of bacteria causing  your infection. °HOME CARE INSTRUCTIONS °· If you were prescribed antibiotics, take them exactly as your caregiver instructs you. Finish the medication even if you feel better after you have only taken some of the medication. °· Drink enough water and fluids to keep your urine clear or pale yellow. °· Avoid caffeine, tea, and carbonated beverages. They tend to irritate your bladder. °· Empty your bladder often. Avoid holding urine for long periods of time. °· Empty your bladder before and after sexual intercourse. °· After a bowel movement, women should cleanse from front to back. Use each tissue only once. °SEEK MEDICAL CARE IF:  °· You have back pain. °· You develop a fever. °· Your symptoms do not begin to resolve within 3 days. °SEEK IMMEDIATE MEDICAL CARE IF:  °· You have severe back pain or lower abdominal pain. °· You develop chills. °· You have nausea or vomiting. °· You have continued burning or discomfort with urination. °MAKE SURE YOU:  °· Understand these instructions. °· Will watch your condition. °· Will get help right away if you are not doing well or get worse. °Document Released: 12/20/2004 Document Revised: 09/11/2011 Document Reviewed: 04/20/2011 °ExitCare® Patient Information ©2015 [employer].   This information is not intended to replace advice given to you by your health care provider. Make sure you discuss any questions you have with your health care provider. ° °

## 2014-04-12 NOTE — Progress Notes (Signed)
Pt discharge instructions, home medications stored at nurses desk with container, and prescriptions given. Pt verbalized understanding. VSS. Denies pain. Pt left floor ambulating.

## 2014-04-12 NOTE — Discharge Summary (Addendum)
Physician Discharge Summary  Matthew Moon ZOX:[id number] DOB: 1946/02/02 DOA: 04/11/2014  PCP: Pcp Not In System  Admit date: 04/11/2014 Discharge date: 04/12/2014  Time spent: 45 minutes  Recommendations for Outpatient Follow-up:  Patient will be discharged home. He is to follow-up with his primary care physician within one week of discharge. Patient was instructed to return to the emergency department if his symptoms returned and worsened. Patient should follow a heart healthy diet. Patient may resume activity as tolerated. Patient should continue his medications as prescribed. Patient will need repeat BMP within 1 week of discharge.  Discharge Diagnoses:  Urinary tract infection/hematuria Community-acquired pneumonia Diarrhea Coronary artery disease Hypertension Hypokalemia  Discharge Condition: Stable  Diet recommendation: Heat healthy  Filed Weights   04/11/14 1758 04/11/14 2132  Weight: 111.131 kg (245 lb) 111.585 kg (246 lb)    History of present illness:  On 04/11/2014 by Dr. Lorretta Harp Matthew Moon is a 69 y.o. male with PMH of coronary artery disease (S/P stent 2015), hypertension, history of smoking (quit on 01/2014), who presents with increased urinary frequency, burning on urination, diarrhea, cough, mild shortness of breath. Patient reports that in the past 3 days, he has been having fever, chills, increased urinary frequency, burning on urination, coughing, mild shortness of breath. He was seen in the ER at around 1 AM and diagnosed as UTI and pneumonia. He was given 1 dose of Levaquin by IV, and discharged home on oral Levaquin. His blood and urine were collected for culture. He feels little better after went home. He was called this afternoon with the positive results and told to come back to the ER. Patient also reports having diarrhea in past 3 days. He has 5-6 watery bowel movements each day. No nausea, vomiting or abdominal pain. He did not use antibiotics  recently. In ED, He had temperature of 103. Mild tachycardia. WBC 10.4. Potassium 3.3. Troponin negative. Lactate 0.98. Chest x-ray showed right middle lobe infiltration. Preliminary blood culture result showed gram negative rods bacteremia. Patient is admitted to inpatient for further evaluation and treatment.  Hospital Course:  Urinary tract Infection/hematuria -No complaints of painful urination upon admission however at this time, states his pain has resolved. -UA showed many bacteria, WBC TNTC, positive nitrites, large leukocytes -Hemoglobin has remained stable -Patient was started on Levaquin -Patient currently afebrile with no leukocytosis -Advised patient to follow-up with a urologist -Urine culture pending and can be monitored by his primary care physician  Community acquired pneumonia -CXR: Patchy right middle lobe opacity concerning for pneumonia -Patient no longer feels short of breath -Strep pneumonia urine antigen negative -Patient afebrile with no leukocytosis -Continue Levaquin, antitussives  Diarrhea -C. difficile PCR negative -Diarrhea has resolved, possibly due to viral gastroenteritis  Coronary artery disease -Stable, no complaints of chest pain -Continue aspirin, Plavix, statin -HCTZ and losartan were held at admission but may be resumed at discharge  Hypertension -Continue home medications  Hypokalemia -Replaced, patient should have repeat BMP within 1 week of discharge  Procedures: None  Consultations: None  Discharge Exam: Filed Vitals:   04/12/14 0940  BP: 123/62  Pulse: 92  Temp: 99 F (37.2 C)  Resp: 18     General: Well developed, well nourished, NAD, appears stated age  HEENT: NCAT, mucous membranes moist.  Cardiovascular: S1 S2 auscultated, no rubs, murmurs or gallops. Regular rate and rhythm.  Respiratory: Essentially clear, occ cough   Abdomen: Soft, nontender, nondistended, + bowel sounds  Extremities: warm dry without  cyanosis clubbing  or edema  Neuro: AAOx3, nonfocal  Psych: Normal affect and demeanor with intact judgement and insight  Discharge Instructions      Discharge Instructions    Discharge instructions    Complete by:  As directed   Patient will be discharged home. He is to follow-up with his primary care physician within one week of discharge. Patient was instructed to return to the emergency department if his symptoms returned and worsened. Patient should follow a heart healthy diet. Patient may resume activity as tolerated. Patient should continue his medications as prescribed.            Medication List    TAKE these medications        acetaminophen 500 MG tablet  Commonly known as:  TYLENOL  Take 500 mg by mouth every 6 (six) hours as needed for fever.     aspirin EC 81 MG tablet  Take 81 mg by mouth daily.     HYDROCHLOROTHIAZIDE PO  Take 50 mg by mouth daily.     levofloxacin 750 MG tablet  Commonly known as:  LEVAQUIN  Take 1 tablet (750 mg total) by mouth daily after lunch.  Start taking on:  04/13/2014     LOSARTAN POTASSIUM PO  Take 50 mg by mouth daily.     PLAVIX PO  Take 50 mg by mouth daily.     predniSONE 10 MG tablet  Commonly known as:  STERAPRED UNI-PAK  Take by mouth daily. Prednisone dosing: Take  Prednisone 40mg  (4 tabs) x 3 days, then taper to 30mg  (3 tabs) x 3 days, then 20mg  (2 tabs) x 3days, then 10mg  (1 tab) x 3days, then STOP     ZOCOR PO  Take 25 mg by mouth daily.       No Known Allergies Follow-up Information    [facility] Schedule an appointment as soon as possible for a visit in 1 week.   Why:  Hospital followup       The results of significant diagnostics from this hospitalization (including imaging, microbiology, ancillary and laboratory) are listed below for reference.    Significant Diagnostic Studies: Dg Chest 2 View  04/11/2014   CLINICAL DATA:  Shortness of breath.  Fever.  EXAM:  CHEST  2 VIEW  COMPARISON:  None.  FINDINGS: There is patchy opacity in the right middle lobe concerning for pneumonia. Minimal atelectasis at the left lung base. Lung volumes are low, size is normal. No pulmonary edema. There is no pleural effusion or pneumothorax. No acute osseous abnormalities are seen.  IMPRESSION: Patchy right middle lobe opacity concerning for pneumonia. Minimal atelectasis at the left lung base.   Electronically Signed   By: Rubye OaksMelanie  Ehinger M.D.   On: 04/11/2014 02:20    Microbiology: Recent Results (from the past 240 hour(s))  Blood culture (routine x 2)     Status: None (Preliminary result)   Collection Time: 04/11/14 12:40 AM  Result Value Ref Range Status   Specimen Description BLOOD RIGHT ARM  Final   Special Requests BOTTLES DRAWN AEROBIC AND ANAEROBIC 5CC EACH  Final   Culture   Final           BLOOD CULTURE RECEIVED NO GROWTH TO DATE CULTURE WILL BE HELD FOR 5 DAYS BEFORE ISSUING A FINAL NEGATIVE REPORT [facility]    Report Status PENDING  Incomplete  Blood culture (routine x 2)     Status: None (Preliminary result)  Collection Time: 04/11/14 12:50 AM  Result Value Ref Range Status   Specimen Description BLOOD LEFT ARM  Final   Special Requests BOTTLES DRAWN AEROBIC AND ANAEROBIC 5CC EACH  Final   Culture   Final    ESCHERICHIA COLI Note: Gram Stain Report Called to,Read Back By and Verified With: SHANNON GAMMONS 04/11/14 1430 BY SMITHERSJ Performed at [facility]    Report Status PENDING  Incomplete  Urine culture     Status: None (Preliminary result)   Collection Time: 04/11/14  1:55 AM  Result Value Ref Range Status   Specimen Description URINE, CLEAN CATCH  Final   Special Requests NONE  Final   Colony Count   Final    >=100,000 COLONIES/ML Performed at [facility]    Culture   Final    ESCHERICHIA COLI Performed at [facility]    Report Status PENDING  Incomplete  Clostridium Difficile by  PCR     Status: None   Collection Time: 04/12/14  1:45 AM  Result Value Ref Range Status   C difficile by pcr NEGATIVE NEGATIVE Final     Labs: Basic Metabolic Panel:  Recent Labs Lab 04/11/14 0050 04/11/14 1827 04/12/14 0513  NA 135 136 138  K 3.2* 3.3* 3.2*  CL 104 103 103  CO2 GLUCOSE 128* 134* 114*  BUN 24* 19 16  CREATININE 1.29 1.22 1.22  CALCIUM 8.6 8.4 8.4   Liver Function Tests:  Recent Labs Lab 04/11/14 1827 04/12/14 0513  AST 30 29  ALT 33 31  ALKPHOS 68 63  BILITOT 0.6 0.6  PROT 6.0 5.9*  ALBUMIN 3.1* 2.8*   No results for input(s): LIPASE, AMYLASE in the last 168 hours. No results for input(s): AMMONIA in the last 168 hours. CBC:  Recent Labs Lab 04/11/14 0050 04/11/14 1827 04/12/14 0513  WBC 9.0 10.4 9.6  NEUTROABS 7.3 7.9* 6.6  HGB 13.3 12.2* 12.3*  HCT 38.5* 34.7* 35.5*  MCV 91.9 90.1 90.6  PLT 121* 122* 136*   Cardiac Enzymes:  Recent Labs Lab 04/11/14 0050  TROPONINI 0.03   BNP: BNP (last 3 results) No results for input(s): PROBNP in the last 8760 hours. CBG: No results for input(s): GLUCAP in the last 168 hours.     SignedEdsel Petrin  [facility] 04/12/2014, 10:18 AM

## 2014-04-13 LAB — URINE CULTURE: Colony Count: >=100000

## 2014-04-13 LAB — CULTURE, BLOOD (ROUTINE X 2)

## 2014-04-13 LAB — LEGIONELLA ANTIGEN, URINE

## 2014-04-13 LAB — HIV ANTIBODY (ROUTINE TESTING W REFLEX)
HIV 1/O/2 Abs-Index Value: <1 (ref <1.00)
HIV-1/HIV-2 Ab: NONREACTIVE

## 2014-04-13 NOTE — ED Provider Notes (Signed)
Call[facility] by [facility] for positive blood cultures drawn on 1/17. Gram neg rods. Per review of notes, already known and pt was admitted and discharged yesterday for the same.   Raeford RazorStephen Zakyra [place], MD 04/13/14 [phone]

## 2014-04-14 LAB — RESPIRATORY VIRUS PANEL
Adenovirus: NEGATIVE
Influenza A: NEGATIVE
Influenza B: NEGATIVE
Metapneumovirus: POSITIVE — AB
PARAINFLUENZA 1 A: NEGATIVE
Parainfluenza 2: NEGATIVE
Parainfluenza 3: NEGATIVE
RESPIRATORY SYNCYTIAL VIRUS B: NEGATIVE
Respiratory Syncytial Virus A: NEGATIVE
Rhinovirus: NEGATIVE

## 2014-04-15 LAB — CULTURE, BLOOD (ROUTINE X 2)

## 2014-04-16 LAB — GI PATHOGEN PANEL BY PCR, STOOL
C difficile toxin A/B: NOT DETECTED
CAMPYLOBACTER BY PCR: NOT DETECTED
Cryptosporidium by PCR: NOT DETECTED
E coli (ETEC) LT/ST: NOT DETECTED
E coli (STEC): NOT DETECTED
E coli 0157 by PCR: NOT DETECTED
G lamblia by PCR: NOT DETECTED
NOROVIRUS G1/G2: NOT DETECTED
ROTAVIRUS A BY PCR: NOT DETECTED
SALMONELLA BY PCR: NOT DETECTED
SHIGELLA BY PCR: NOT DETECTED

## 2014-04-17 ENCOUNTER — Telehealth (HOSPITAL_BASED_OUTPATIENT_CLINIC_OR_DEPARTMENT_OTHER): Payer: Self-pay | Admitting: Emergency Medicine

## 2014-04-17 NOTE — Telephone Encounter (Signed)
Post ED Visit - Positive Culture Follow-up  Culture report reviewed by antimicrobial stewardship pharmacist: []  Wes Dulaney, Pharm.D., BCPS []  Celedonio MiyamotoJeremy Frens, [address] BCPS []  Georgina PillionElizabeth Martin, [address] BCPS [x]  [place]ear Valley SpringsMinh Pham, [address] BCPS, AAHIVP []  Estella HuskMichelle Turner, Pharm.D., BCPS, AAHIVP []  Elder CyphersLorie Poole, [address] BCPS  Positive Blood culture Treated with Levaquin, organism sensitive to the same and no further patient follow-up is required at this time.  Jiles HaroldGammons, Lochlann Mastrangelo Chaney 04/17/2014, 12:58 PM

## 2014-04-18 LAB — CULTURE, BLOOD (ROUTINE X 2)
Culture: NO GROWTH
Culture: NO GROWTH

## 2015-10-19 IMAGING — CR DG CHEST 2V
2 series · 2 of 2 positions shown · non-contrast
Comparison: None.

CLINICAL DATA: Shortness of breath.  Fever.

EXAM:
CHEST  2 VIEW

[w chest pa]
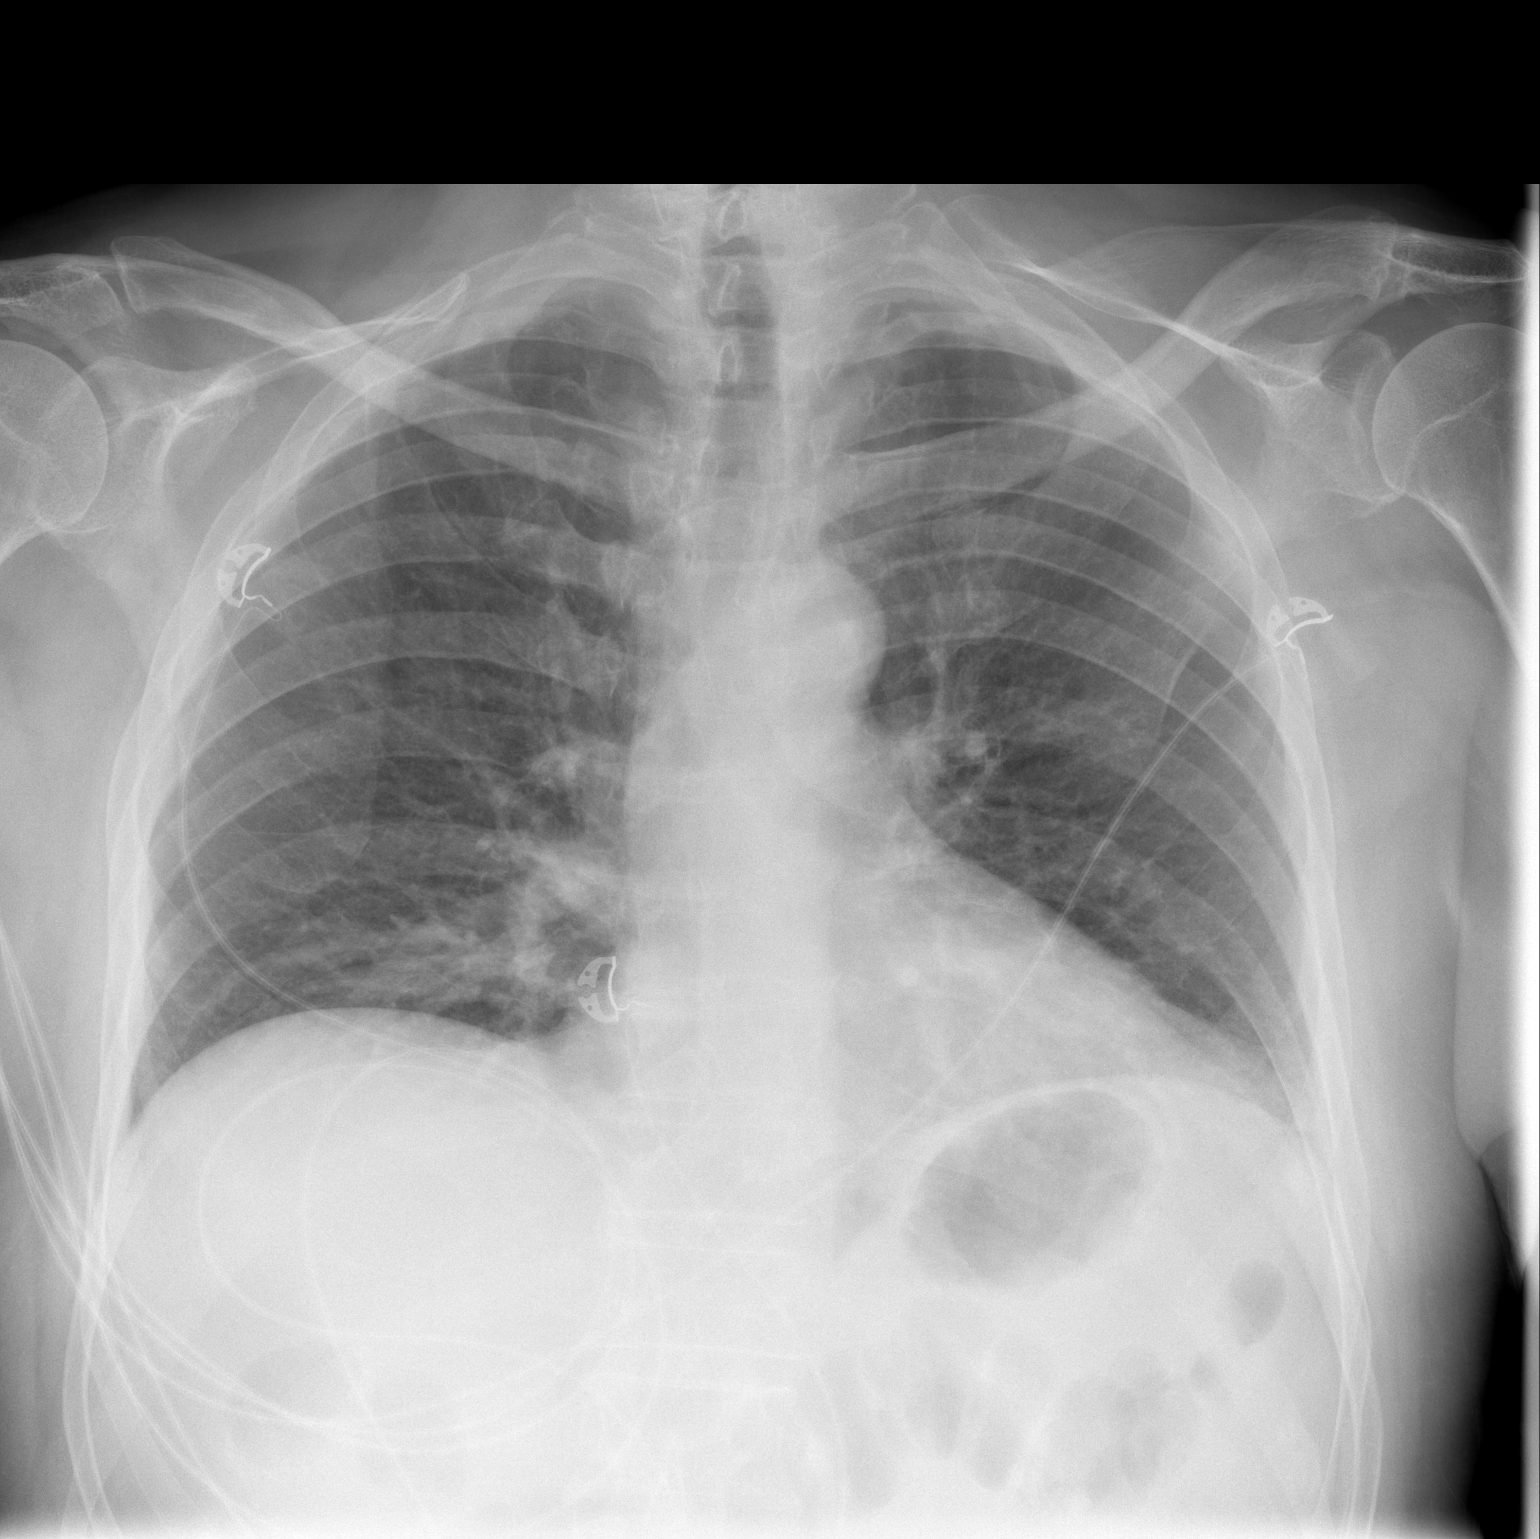

[w chest lat]
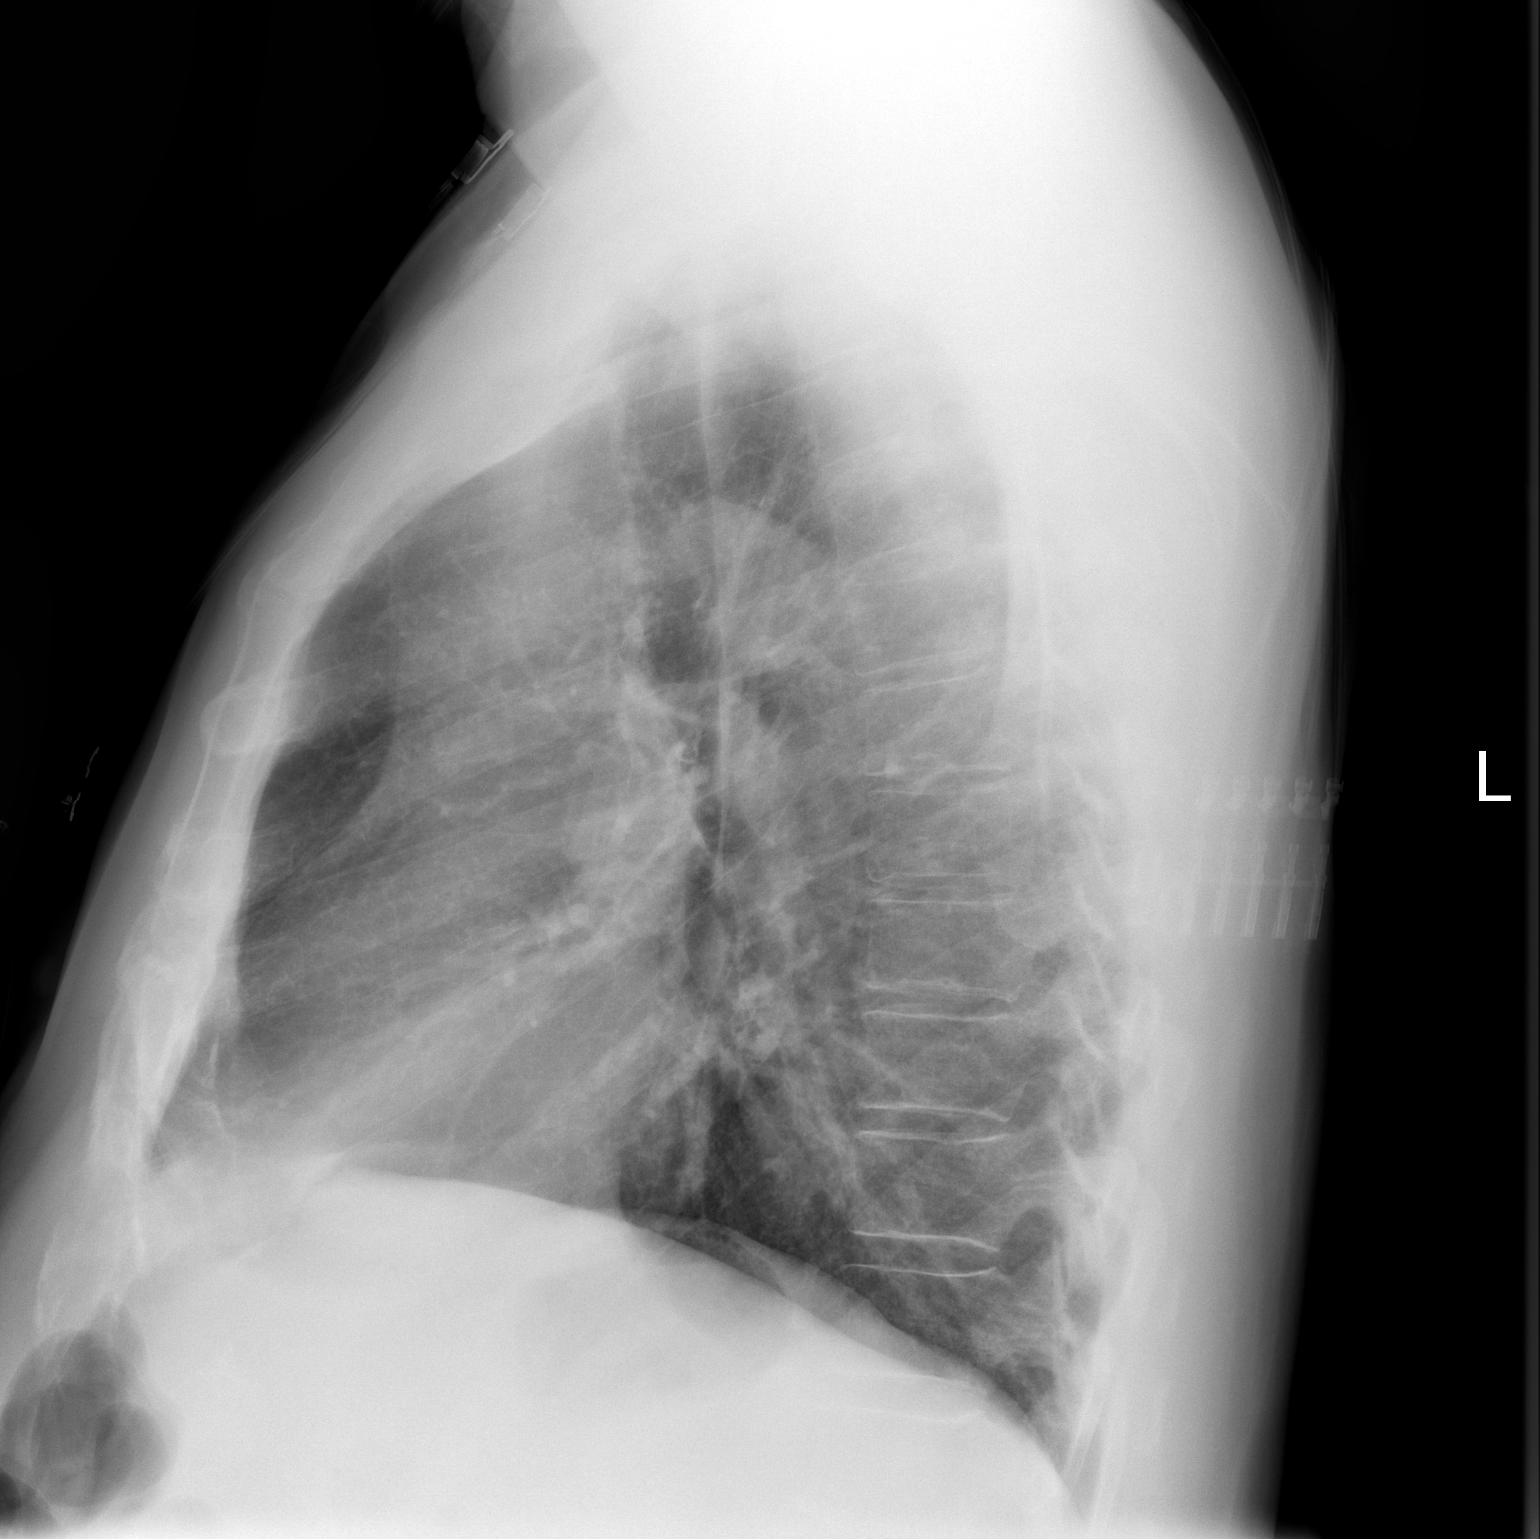

[2 of 2 positions shown; findings below may reference images not displayed]

FINDINGS: There is patchy opacity in the right middle lobe concerning for
pneumonia. Minimal atelectasis at the left lung base. Lung volumes
are low, size is normal. No pulmonary edema. There is no pleural
effusion or pneumothorax. No acute osseous abnormalities are seen.
IMPRESSION: Patchy right middle lobe opacity concerning for pneumonia. Minimal
atelectasis at the left lung base.

## 2019-04-15 ENCOUNTER — Ambulatory Visit: Payer: Non-veteran care | Attending: Internal Medicine

## 2019-04-15 DIAGNOSIS — Z20822 Contact with and (suspected) exposure to covid-19: Secondary | ICD-10-CM
